# Patient Record
Sex: Male | Born: 1990 | Race: White | Hispanic: No | Marital: Single | Smoking: Current every day smoker
Health system: Southern US, Community
[De-identification: ages and names within clinical notes are randomized; demographics above are authoritative.]

---

## 2013-05-03 ENCOUNTER — Encounter (HOSPITAL_COMMUNITY): Payer: Self-pay | Admitting: Emergency Medicine

## 2013-05-03 ENCOUNTER — Emergency Department (HOSPITAL_COMMUNITY)
Admission: EM | Admit: 2013-05-03 | Discharge: 2013-05-03 | Disposition: A | Discharge status: Home / Self Care | Payer: 59 | Source: Home / Self Care

## 2013-05-03 DIAGNOSIS — R05 Cough: Secondary | ICD-10-CM

## 2013-05-03 DIAGNOSIS — J9801 Acute bronchospasm: Secondary | ICD-10-CM

## 2013-05-03 DIAGNOSIS — R059 Cough, unspecified: Secondary | ICD-10-CM

## 2013-05-03 DIAGNOSIS — J069 Acute upper respiratory infection, unspecified: Secondary | ICD-10-CM

## 2013-05-03 MED ORDER — ALBUTEROL SULFATE HFA 108 (90 BASE) MCG/ACT IN AERS: 2.0000 | INHALATION_SPRAY | RESPIRATORY_TRACT | Status: DC | PRN

## 2013-05-03 NOTE — ED Provider Notes (Signed)
CSN: [id number]     Arrival date & time 05/03/13  1246 History   First MD Initiated Contact with Patient 05/03/13 1432     No chief complaint on file.  (Consider location/radiation/quality/duration/timing/severity/associated sxs/prior Treatment) HPI Comments: Cough, congestion, nasal congestion, rhinorrhea, PND, itchy throat and ears. Fever x 1 d 1 wk ago. Since resoved Smoker   History reviewed. No pertinent past medical history. History reviewed. No pertinent past surgical history. History reviewed. No pertinent family history. History  Substance Use Topics  . Smoking status: Current Every Day Smoker -- 0.10 packs/day    Types: Cigarettes  . Smokeless tobacco: Not on file  . Alcohol Use: Yes     Comment: occasional    Review of Systems  Constitutional: Positive for fever and activity change. Negative for chills, diaphoresis and fatigue.  HENT: Positive for congestion, postnasal drip, rhinorrhea and sore throat. Negative for ear pain and facial swelling.   Eyes: Negative for pain, discharge and redness.  Respiratory: Positive for cough. Negative for chest tightness, shortness of breath and wheezing.   Cardiovascular: Negative.   Gastrointestinal: Negative.   Musculoskeletal: Negative.  Negative for neck pain and neck stiffness.  Neurological: Negative.     Allergies  Review of patient's allergies indicates no known allergies.  Home Medications   Current Outpatient Rx  Name  Route  Sig  Dispense  Refill  . pseudoephedrine (SUDAFED) 30 MG tablet   Oral   Take 30 mg by mouth every 4 (four) hours as needed for congestion.          BP 118/85  Pulse 79  Temp(Src) 98 F (36.7 C) (Oral)  Resp 16  SpO2 100% Physical Exam  Nursing note and vitals reviewed. Constitutional: He is oriented to person, place, and time. He appears well-developed and well-nourished. No distress.  HENT:  Mouth/Throat: No oropharyngeal exudate.  bilat TM's nl OP with erythema, cobblestoning,  clear PND  Eyes: Conjunctivae and EOM are normal.  Neck: Normal range of motion. Neck supple.  Cardiovascular: Normal rate, regular rhythm and normal heart sounds.   Pulmonary/Chest: Effort normal and breath sounds normal. No respiratory distress.  Faint expiratory wheeze and modest prolonged expiratory phase.   Musculoskeletal: Normal range of motion. He exhibits no edema.  Lymphadenopathy:    He has no cervical adenopathy.  Neurological: He is alert and oriented to person, place, and time. He exhibits normal muscle tone.  Skin: Skin is warm and dry. No rash noted.  Psychiatric: He has a normal mood and affect.    ED Course  Procedures (including critical care time) Labs Review Labs Reviewed - No data to display Imaging Review No results found.   MDM   1. URI (upper respiratory infection)   2. Cough   3. Bronchospasm    Alka Seltzer COld Plus Night Robitussin DM Albuterol HFA Stop smoking.    Hayden Rasmussenavid Marshea Wisher, NP 05/03/13 1454

## 2013-05-03 NOTE — ED Notes (Signed)
C/o breathing problems States he does have cold sx; cough, congestion, vomiting and diarrhea Cough med and rest was used as tx No hx of asthma

## 2013-05-03 NOTE — ED Notes (Signed)
Cold symptoms since last Tues., N,V and D on Sat, Sun. and Mon.  Had a fever early AM 3/10 and stopped on 3/12.

## 2013-05-03 NOTE — ED Provider Notes (Signed)
Medical screening examination/treatment/procedure(s) were performed by resident physician or non-physician practitioner and as supervising physician I was immediately available for consultation/collaboration.   Dekker Verga DOUGLAS MD.   Arrayah Connors D Cordel Drewes, MD 05/03/13 1550 

## 2013-05-03 NOTE — Discharge Instructions (Signed)
Bronchospasm, Adult A bronchospasm is a spasm or tightening of the airways going into the lungs. During a bronchospasm breathing becomes more difficult because the airways get smaller. When this happens there can be coughing, a whistling sound when breathing (wheezing), and difficulty breathing. Bronchospasm is often associated with asthma, but not all patients who experience a bronchospasm have asthma. CAUSES  A bronchospasm is caused by inflammation or irritation of the airways. The inflammation or irritation may be triggered by:   Allergies (such as to animals, pollen, food, or mold). Allergens that cause bronchospasm may cause wheezing immediately after exposure or many hours later.   Infection. Viral infections are believed to be the most common cause of bronchospasm.   Exercise.   Irritants (such as pollution, cigarette smoke, strong odors, aerosol sprays, and paint fumes).   Weather changes. Winds increase molds and pollens in the air. Rain refreshes the air by washing irritants out. Cold air may cause inflammation.   Stress and emotional upset.  SIGNS AND SYMPTOMS   Wheezing.   Excessive nighttime coughing.   Frequent or severe coughing with a simple cold.   Chest tightness.   Shortness of breath.  DIAGNOSIS  Bronchospasm is usually diagnosed through a history and physical exam. Tests, such as chest X-rays, are sometimes done to look for other conditions. TREATMENT   Inhaled medicines can be given to open up your airways and help you breathe. The medicines can be given using either an inhaler or a nebulizer machine.  Corticosteroid medicines may be given for severe bronchospasm, usually when it is associated with asthma. HOME CARE INSTRUCTIONS   Always have a plan prepared for seeking medical care. Know when to call your health care provider and local emergency services (911 in the [place] Know where you can access local emergency care.  Only take medicines as  directed by your health care provider.  If you were prescribed an inhaler or nebulizer machine, ask your health care provider to explain how to use it correctly. Always use a spacer with your inhaler if you were given one.  It is necessary to remain calm during an attack. Try to relax and breathe more slowly.  Control your home environment in the following ways:   Change your heating and air conditioning filter at least once a month.   Limit your use of fireplaces and wood stoves.  Do not smoke and do not allow smoking in your home.   Avoid exposure to perfumes and fragrances.   Get rid of pests (such as roaches and mice) and their droppings.   Throw away plants if you see mold on them.   Keep your house clean and dust free.   Replace carpet with wood, tile, or vinyl flooring. Carpet can trap dander and dust.   Use allergy-proof pillows, mattress covers, and box spring covers.   Wash bed sheets and blankets every week in hot water and dry them in a dryer.   Use blankets that are made of polyester or cotton.   Wash hands frequently. SEEK MEDICAL CARE IF:   You have muscle aches.   You have chest pain.   The sputum changes from clear or white to yellow, green, gray, or bloody.   The sputum you cough up gets thicker.   There are problems that may be related to the medicine you are given, such as a rash, itching, swelling, or trouble breathing.  SEEK IMMEDIATE MEDICAL CARE IF:   You have worsening wheezing and coughing  even after taking your prescribed medicines.   You have increased difficulty breathing.   You develop severe chest pain. MAKE SURE YOU:   Understand these instructions.  Will watch your condition.  Will get help right away if you are not doing well or get worse. Document Released: 02/06/2003 Document Revised: 10/06/2012 Document Reviewed: 07/26/2012 [facility] Patient Information 2014 [place], [place].  How to Use an Inhaler Using  your inhaler correctly is very important. Good technique will make sure that the medicine reaches your lungs.  HOW TO USE AN INHALER: 1. Take the cap off the inhaler. 2. If this is the first time using your inhaler, you need to prime it. Shake the inhaler for 5 seconds. Release four puffs into the air, away from your face. Ask your doctor for help if you have questions. 3. Shake the inhaler for 5 seconds. 4. Turn the inhaler so the bottle is above the mouthpiece. 5. Put your pointer finger on top of the bottle. Your thumb holds the bottom of the inhaler. 6. Open your mouth. 7. Either hold the inhaler away from your mouth (the width of 2 fingers) or place your lips tightly around the mouthpiece. Ask your doctor which way to use your inhaler. 8. Breathe out as much air as possible. 9. Breathe in and push down on the bottle 1 time to release the medicine. You will feel the medicine go in your mouth and throat. 10. Continue to take a deep breath in very slowly. Try to fill your lungs. 11. After you have breathed in completely, hold your breath for 10 seconds. This will help the medicine to settle in your lungs. If you cannot hold your breath for 10 seconds, hold it for as long as you can before you breathe out. 12. Breathe out slowly, through pursed lips. Whistling is an example of pursed lips. 13. If your doctor has told you to take more than 1 puff, wait at least 15 30 seconds between puffs. This will help you get the best results from your medicine. Do not use the inhaler more than your doctor tells you to. 14. Put the cap back on the inhaler. 15. Follow the directions from your doctor or from the inhaler package about cleaning the inhaler. If you use more than one inhaler, ask your doctor which inhalers to use and what order to use them in. Ask your doctor to help you figure out when you will need to refill your inhaler.  If you use a steroid inhaler, always rinse your mouth with water after your  last puff, gargle and spit out the water. Do not swallow the water. GET HELP IF:  The inhaler medicine only partially helps to stop wheezing or shortness of breath.  You are having trouble using your inhaler.  You have some increase in thick spit (phlegm). GET HELP RIGHT AWAY IF:  The inhaler medicine does not help your wheezing or shortness of breath or you have tightness in your chest.  You have dizziness, headaches, or fast heart rate.  You have chills, fever, or night sweats.  You have a large increase of thick spit, or your thick spit is bloody. MAKE SURE YOU:   Understand these instructions.  Will watch your condition.  Will get help right away if you are not doing well or get worse. Document Released: 11/13/2007 Document Revised: 11/24/2012 Document Reviewed: 09/02/2012 [facility] Patient Information 2014 [place], [place].  Upper Respiratory Infection, Adult An upper respiratory infection (URI) is also known  as the common cold. It is often caused by a type of germ (virus). Colds are easily spread (contagious). You can pass it to others by kissing, coughing, sneezing, or drinking out of the same glass. Usually, you get better in 1 or 2 weeks.  HOME CARE   Only take medicine as told by your doctor.  Use a warm mist humidifier or breathe in steam from a hot shower.  Drink enough water and fluids to keep your pee (urine) clear or pale yellow.  Get plenty of rest.  Return to work when your temperature is back to normal or as told by your doctor. You may use a face mask and wash your hands to stop your cold from spreading. GET HELP RIGHT AWAY IF:   After the first few days, you feel you are getting worse.  You have questions about your medicine.  You have chills, shortness of breath, or brown or red spit (mucus).  You have yellow or brown snot (nasal discharge) or pain in the face, especially when you bend forward.  You have a fever, puffy (swollen) neck, pain when you  swallow, or white spots in the back of your throat.  You have a bad headache, ear pain, sinus pain, or chest pain.  You have a high-pitched whistling sound when you breathe in and out (wheezing).  You have a lasting cough or cough up blood.  You have sore muscles or a stiff neck. MAKE SURE YOU:   Understand these instructions.  Will watch your condition.  Will get help right away if you are not doing well or get worse. Document Released: 07/23/2007 Document Revised: 04/28/2011 Document Reviewed: 06/10/2010 [facility] Patient Information 2014 [place], [place].

## 2015-07-10 ENCOUNTER — Encounter (HOSPITAL_COMMUNITY): Payer: Self-pay | Admitting: Emergency Medicine

## 2015-07-10 ENCOUNTER — Ambulatory Visit (HOSPITAL_COMMUNITY)
Admission: RE | Admit: 2015-07-10 | Discharge: 2015-07-10 | Disposition: A | Discharge status: Home / Self Care | Payer: 59 | Attending: Psychiatry | Admitting: Psychiatry

## 2015-07-10 DIAGNOSIS — F331 Major depressive disorder, recurrent, moderate: Secondary | ICD-10-CM | POA: Diagnosis present

## 2015-07-10 DIAGNOSIS — F17210 Nicotine dependence, cigarettes, uncomplicated: Secondary | ICD-10-CM | POA: Insufficient documentation

## 2015-07-10 DIAGNOSIS — F1010 Alcohol abuse, uncomplicated: Secondary | ICD-10-CM | POA: Diagnosis present

## 2015-07-10 NOTE — BH Assessment (Addendum)
Assessment Note  Robert Mayer is a 25 y.o. male [facility] due to "depression has been spiking and bringing thoughts to hurt myself". Pt shared that he took a bunch of ibuprofen a week and a half ago in a suicide attempt and yesterday, [place]. Pt reports that his parents IVC'd him and [facility] and was d/c this morning. Pt denies current SI, but indicates that he has an appt with his therapist tomorrow and an initial appt scheduled with a psychiatrist on 07/26/15. Pt reported that he only feels suicidal when he's coming down from a binge drinking episode.   Diagnosis: MDD, recurrent episode, moderate; Alcohol use d/o  Past Medical History: History reviewed. No pertinent past medical history.  History reviewed. No pertinent past surgical history.  Family History: History reviewed. No pertinent family history.  Social History:  reports that he has been smoking Cigarettes.  He has been smoking about 0.10 packs per day. He does not have any smokeless tobacco history on file. He reports that he drinks alcohol. He reports that he does not use illicit drugs.  Additional Social History:  Alcohol / Drug Use Pain Medications: none reported Prescriptions: none reported Over the Counter: none reported History of alcohol / drug use?: Yes Longest period of sobriety (when/how long): 6 months Substance #1 Name of Substance 1: Alcohol 1 - Age of First Use: 11 1 - Amount (size/oz): binge drinking 1 - Frequency: depends on level of depression/anxiety 1 - Duration: 3 months 1 - Last Use / Amount: Sunday night into Monday morning (5/21-5/22)  CIWA:   COWS:    Allergies: No Known Allergies  Home Medications:  (Not in a hospital admission)  OB/GYN Status:  No LMP for male patient.  General Assessment Data [facility] T[facility] Assessment: In system Is this a Tele  or Face-to-Face Assessment?: Face-to-Face Is this an Initial Assessment or a Re-assessment for this encounter?: Initial Assessment Marital status: Single Is patient pregnant?: No Pregnancy Status: No Living Arrangements: Other (Comment) (roommate) Can pt return to current living arrangement?: Yes Admission Status: Voluntary Is patient capable of signing voluntary admission?: Yes Referral Source: Self/Family/Friend [facility]  Medical Screening Exam [facility] Walk[facility] ONLY) Medical Exam completed: No Reason for MSE not completed: Patient Refused  Crisis Care Plan Living Arrangements: Other (Comment) (roommate) Name of Psychiatrist: First appt for 07/26/2015-pt didn't have provider info on hand Name of Therapist: Rochel BromeGray [place] w/ [facility]  Education Status Is patient currently in school?: No  Risk to self with the past 6 months Suicidal Ideation: No-Not Currently/Within Last 6 Months Has patient been a risk to self within the past 6 months prior to admission? : Yes Suicidal Intent: No-Not Currently/Within Last 6 Months Has patient had any suicidal intent within the past 6 months prior to admission? : Yes Is patient at risk for suicide?: No Suicidal Plan?: No-Not Currently/Within Last 6 Months Has patient had any suicidal plan within the past 6 months prior to admission? : Yes Access to Means: Yes Specify Access to Suicidal Means: pt has access to ibuprofen  What has been your use of drugs/alcohol within the last 12 months?: see above Previous Attempts/Gestures: Yes How many times?: 1 Other Self Harm Risks: 0 Triggers for Past Attempts: Other (Comment) (coming off of drinking binge) Intentional Self Injurious Behavior: None Family Suicide History:  No Recent stressful life event(s): Other (Comment) (relapsed on alcohol) Persecutory voices/beliefs?: No Depression: Yes Depression Symptoms: Isolating Substance abuse history and/or treatment for substance abuse?:  No Suicide prevention information given to non-admitted patients: Yes  Risk to Others within the past 6 months Homicidal Ideation: No Does patient have any lifetime risk of violence toward others beyond the six months prior to admission? : No Thoughts of Harm to Others: No Current Homicidal Intent: No Current Homicidal Plan: No Access to Homicidal Means: No History of harm to others?: No Assessment of Violence: None Noted Violent Behavior Description: none noted Does patient have access to weapons?: No Criminal Charges Pending?: No Does patient have a court date: No Is patient on probation?: No  Psychosis Hallucinations: None noted Delusions: None noted  Mental Status Report Appearance/Hygiene: Unremarkable Eye Contact: Good Motor Activity: Unremarkable Speech: Logical/coherent Level of Consciousness: Alert Mood: Sad, Pleasant Affect: Appropriate to circumstance Anxiety Level: Minimal Thought Processes: Coherent, Relevant Judgement: Unimpaired Orientation: Person, Place, Time, Situation, Appropriate for developmental age Obsessive Compulsive Thoughts/Behaviors: None  Cognitive Functioning Concentration: Normal Memory: Recent Intact, Remote Intact IQ: Average Insight: Good Impulse Control: Fair Appetite: Poor Weight Loss: 0 Sleep: No Change Vegetative Symptoms: None  ADLScreening [facility] Assessment Services) Patient's cognitive ability adequate to safely complete daily activities?: Yes Patient able to express need for assistance with ADLs?: Yes Independently performs ADLs?: Yes (appropriate for developmental age)  Prior Inpatient Therapy Prior Inpatient Therapy: Yes Prior Therapy Dates: 06/2014; 06/2015 Prior Therapy Facilty/Provider(s): [facility] in [place]; [facility] Reason for Treatment: Alcoholism; IVC  Prior Outpatient Therapy Prior Outpatient Therapy: No Does patient have an ACCT team?: No Does patient have Intensive In-House Services?  : No Does  patient have [facility] services? : No Does patient have P4CC services?: No  ADL Screening (condition at time of admission) Patient's cognitive ability adequate to safely complete daily activities?: Yes Is the patient deaf or have difficulty hearing?: No Does the patient have difficulty seeing, even when wearing glasses/contacts?: No Does the patient have difficulty concentrating, remembering, or making decisions?: No Patient able to express need for assistance with ADLs?: Yes Does the patient have difficulty dressing or bathing?: No Independently performs ADLs?: Yes (appropriate for developmental age) Does the patient have difficulty walking or climbing stairs?: No Weakness of Legs: None Weakness of Arms/Hands: None  Home Assistive Devices/Equipment Home Assistive Devices/Equipment: None  Therapy Consults (therapy consults require a physician order) PT Evaluation Needed: No OT Evalulation Needed: No SLP Evaluation Needed: No Abuse/Neglect Assessment (Assessment to be complete while patient is alone) Physical Abuse: Denies Verbal Abuse: Denies Sexual Abuse: Denies Exploitation of patient/patient's resources: Denies Self-Neglect: Denies Values / Beliefs Cultural Requests During Hospitalization: None Spiritual Requests During Hospitalization: None Consults Spiritual Care Consult Needed: No Social Work Consult Needed: No Merchant navy officer (For Healthcare) Does patient have an advance directive?: No Would patient like information on creating an advanced directive?: No - patient declined information    Additional Information 1:1 In Past 12 Months?: No CIRT Risk: No Elopement Risk: No Does patient have medical clearance?: No     Disposition:  Disposition Initial Assessment Completed for this Encounter: Yes Disposition of Patient: Other dispositions (consulted with Hillery Jacks, NP) Other disposition(s): To current provider  On Site Evaluation by:   Reviewed with  Physician:    Laddie Aquas 07/10/2015 6:57 PM

## 2017-11-05 ENCOUNTER — Other Ambulatory Visit: Payer: Self-pay

## 2017-11-05 ENCOUNTER — Ambulatory Visit (HOSPITAL_COMMUNITY)
Admission: EM | Admit: 2017-11-05 | Discharge: 2017-11-05 | Disposition: A | Discharge status: Home / Self Care | Payer: Self-pay | Attending: Family Medicine | Admitting: Family Medicine

## 2017-11-05 ENCOUNTER — Ambulatory Visit (INDEPENDENT_AMBULATORY_CARE_PROVIDER_SITE_OTHER): Payer: Self-pay

## 2017-11-05 ENCOUNTER — Encounter (HOSPITAL_COMMUNITY): Payer: Self-pay | Admitting: *Deleted

## 2017-11-05 DIAGNOSIS — S52201A Unspecified fracture of shaft of right ulna, initial encounter for closed fracture: Secondary | ICD-10-CM

## 2017-11-05 DIAGNOSIS — S59911A Unspecified injury of right forearm, initial encounter: Secondary | ICD-10-CM

## 2017-11-05 NOTE — ED Notes (Signed)
Ortho tech paged and on the way. 

## 2017-11-05 NOTE — ED Provider Notes (Signed)
[facility]   [id number] 11/05/17 Arrival Time: 1401  ASSESSMENT & PLAN:  1. Forearm injury, right, initial encounter   2. Closed fracture of shaft of right ulna, unspecified fracture morphology, initial encounter    Imaging: Dg Forearm Right  Result Date: 11/05/2017 CLINICAL DATA:  Direct trauma to the right forearm now with pain in the medial mid ulnar region with limited range of motion. EXAM: RIGHT FOREARM - 2 VIEW COMPARISON:  None. FINDINGS: The patient has sustained an acute nondisplaced fracture of the junction of the middle and distal thirds of the right ulna. The radius is intact. There is mild soft tissue swelling over the extensor surface of the forearm. The observed portions of the wrist and elbow are normal. IMPRESSION: There is an acute nondisplaced fracture of the junction of the middle and distal thirds of the shaft of the right ulna. Electronically Signed   By: David  [place] M.D.   On: 11/05/2017 14:46   Follow-up Information    Schedule an appointment as soon as possible for a visit  with Bjorn Pippin, MD.   Specialty:  Orthopedic Surgery Contact information: [address] [place] [address] [phone]          Splinted by orthopaedic tech. OTC analgesics as needed.  Reviewed expectations re: course of current medical issues. Questions answered. Outlined signs and symptoms indicating need for more acute intervention. Patient verbalized understanding. After Visit Summary given.  SUBJECTIVE: History from: patient. Robert Mayer is a 27 y.o. male who reports persistent moderate pain of his right forearm; described as aching without radiation. Onset: noticed about 2 days ago after hitting forearm "on something" the night before at a concert/mosh pit; continued discomfort Relieved by: holding still. Worsened by: movement. Associated symptoms: none reported. Extremity sensation changes or weakness: none. Self treatment: has not  tried OTCs for relief of pain. History of similar: no  ROS: As per HPI.   OBJECTIVE:  Vitals:   11/05/17 1412  BP: 136/87  Pulse: 85  Resp: 14  Temp: 98.2 F (36.8 C)  TempSrc: Oral  SpO2: 98%    General appearance: alert; no distress Extremities: warm and well perfused; symmetrical with no gross deformities; poorly localized tenderness over mid R forearm with no swelling and mild bruising; ROM around area or areas of discomfort: normal at R wrist and elbow CV: brisk extremity capillary refill Skin: warm and dry Neurologic: normal gait; normal symmetric reflexes in all extremities; normal sensation in all extremities Psychological: alert and cooperative; normal mood and affect  No Known Allergies   Social History   Socioeconomic History  . Marital status: Single    Spouse name: Not on file  . Number of children: Not on file  . Years of education: Not on file  . Highest education level: Not on file  Occupational History  . Not on file  Social Needs  . Financial resource strain: Not on file  . Food insecurity:    Worry: Not on file    Inability: Not on file  . Transportation needs:    Medical: Not on file    Non-medical: Not on file  Tobacco Use  . Smoking status: Current Every Day Smoker    Packs/day: 0.10    Types: Cigarettes  . Smokeless tobacco: Never Used  Substance and Sexual Activity  . Alcohol use: Yes    Comment: binge drinking  . Drug use: No  . Sexual activity: Not on file  Lifestyle  .  Physical activity:    Days per week: Not on file    Minutes per session: Not on file  . Stress: Not on file  Relationships  . Social connections:    Talks on phone: Not on file    Gets together: Not on file    Attends religious service: Not on file    Active member of club or organization: Not on file    Attends meetings of clubs or organizations: Not on file    Relationship status: Not on file  Other Topics Concern  . Not on file  Social History  Narrative  . Not on file    History reviewed. No pertinent surgical history.    Mardella LaymanHagler, Angellee Cohill, MD 11/14/17 1008

## 2017-11-05 NOTE — Discharge Instructions (Signed)
Please rest, ice and elevate the affected extremity. If not allergic, you may take Motrin 600mg every 8 hours or Tylenol 1000mg every 6 hours or as needed for discomfort. Follow up with orthopaedic surgery within one week for further evaluation. Please call for any appointment. Do not remove your splint. You may use a garbage bag while showering to keep your splint dry. Please return here if you are experiencing increased pain, tingling/numbness, swelling, redness, or fever. °
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
## 2017-11-05 NOTE — Progress Notes (Signed)
Orthopedic Tech Progress Note Patient Details:  Robert Mayer 6/10/Robert Most1992 [id number]  Ortho Devices Type of Ortho Device: Ace wrap, Arm sling, Long arm splint Ortho Device/Splint Location: rue Ortho Device/Splint Interventions: Application   Post Interventions Patient Tolerated: Well Instructions Provided: Care of device   Robert Mayer, Robert Mayer 11/05/2017, 3:27 PM

## 2018-03-29 ENCOUNTER — Emergency Department (HOSPITAL_COMMUNITY)
Admission: EM | Admit: 2018-03-29 | Discharge: 2018-03-30 | Disposition: A | Discharge status: Home / Self Care | Payer: Self-pay | Attending: Emergency Medicine | Admitting: Emergency Medicine

## 2018-03-29 ENCOUNTER — Encounter (HOSPITAL_COMMUNITY): Payer: Self-pay

## 2018-03-29 DIAGNOSIS — Y908 Blood alcohol level of 240 mg/100 ml or more: Secondary | ICD-10-CM | POA: Insufficient documentation

## 2018-03-29 DIAGNOSIS — R45851 Suicidal ideations: Secondary | ICD-10-CM | POA: Insufficient documentation

## 2018-03-29 DIAGNOSIS — Z9114 Patient's other noncompliance with medication regimen: Secondary | ICD-10-CM | POA: Insufficient documentation

## 2018-03-29 DIAGNOSIS — Z046 Encounter for general psychiatric examination, requested by authority: Secondary | ICD-10-CM | POA: Insufficient documentation

## 2018-03-29 DIAGNOSIS — F329 Major depressive disorder, single episode, unspecified: Secondary | ICD-10-CM | POA: Insufficient documentation

## 2018-03-29 DIAGNOSIS — F1010 Alcohol abuse, uncomplicated: Secondary | ICD-10-CM | POA: Insufficient documentation

## 2018-03-29 DIAGNOSIS — Z7289 Other problems related to lifestyle: Secondary | ICD-10-CM | POA: Insufficient documentation

## 2018-03-29 DIAGNOSIS — F331 Major depressive disorder, recurrent, moderate: Secondary | ICD-10-CM

## 2018-03-29 DIAGNOSIS — F17210 Nicotine dependence, cigarettes, uncomplicated: Secondary | ICD-10-CM | POA: Insufficient documentation

## 2018-03-29 LAB — CBC
HCT: 48.8 % (ref 39.0–52.0)
HEMOGLOBIN: 16.1 g/dL (ref 13.0–17.0)
MCH: 31.3 pg (ref 26.0–34.0)
MCHC: 33 g/dL (ref 30.0–36.0)
MCV: 94.9 fL (ref 80.0–100.0)
NRBC: 0 % (ref 0.0–0.2)
Platelets: 251 10*3/uL (ref 150–400)
RBC: 5.14 MIL/uL (ref 4.22–5.81)
RDW: 13.1 % (ref 11.5–15.5)
WBC: 8.1 10*3/uL (ref 4.0–10.5)

## 2018-03-29 LAB — COMPREHENSIVE METABOLIC PANEL
ALK PHOS: 89 U/L (ref 38–126)
ALT: 210 U/L — ABNORMAL HIGH (ref 0–44)
AST: 104 U/L — ABNORMAL HIGH (ref 15–41)
Albumin: 4.7 g/dL (ref 3.5–5.0)
Anion gap: 10 (ref 5–15)
BUN: 6 mg/dL (ref 6–20)
CHLORIDE: 107 mmol/L (ref 98–111)
CO2: 25 mmol/L (ref 22–32)
Calcium: 8.8 mg/dL — ABNORMAL LOW (ref 8.9–10.3)
Creatinine, Ser: 0.93 mg/dL (ref 0.61–1.24)
GFR calc Af Amer: >60 mL/min (ref >60)
GFR calc non Af Amer: >60 mL/min (ref >60)
Glucose, Bld: 110 mg/dL — ABNORMAL HIGH (ref 70–99)
POTASSIUM: 3.8 mmol/L (ref 3.5–5.1)
SODIUM: 142 mmol/L (ref 135–145)
Total Bilirubin: 0.5 mg/dL (ref 0.3–1.2)
Total Protein: 8 g/dL (ref 6.5–8.1)

## 2018-03-29 LAB — RAPID URINE DRUG SCREEN, HOSP PERFORMED
Amphetamines: NOT DETECTED
BENZODIAZEPINES: NOT DETECTED
Barbiturates: NOT DETECTED
COCAINE: NOT DETECTED
OPIATES: NOT DETECTED
TETRAHYDROCANNABINOL: NOT DETECTED

## 2018-03-29 LAB — ACETAMINOPHEN LEVEL: Acetaminophen (Tylenol), Serum: <10 ug/mL — ABNORMAL LOW (ref 10–30)

## 2018-03-29 LAB — SALICYLATE LEVEL

## 2018-03-29 LAB — ETHANOL: Alcohol, Ethyl (B): 283 mg/dL — ABNORMAL HIGH (ref <10)

## 2018-03-29 MED ORDER — LORAZEPAM 2 MG/ML IJ SOLN: 0.0000 mg | Freq: Four times a day (QID) | INTRAMUSCULAR | Status: DC

## 2018-03-29 MED ORDER — LORAZEPAM 2 MG/ML IJ SOLN: 0.0000 mg | Freq: Two times a day (BID) | INTRAMUSCULAR | Status: DC

## 2018-03-29 MED ORDER — THIAMINE HCL 100 MG/ML IJ SOLN: 100.0000 mg | Freq: Every day | INTRAMUSCULAR | Status: DC

## 2018-03-29 MED ORDER — VITAMIN B-1 100 MG PO TABS
100.0000 mg | ORAL_TABLET | Freq: Every day | ORAL | Status: DC
  Administered 2018-03-29 – 2018-03-30 (×2): 100 mg via ORAL
  Filled 2018-03-29 (×2): qty 1

## 2018-03-29 MED ORDER — LORAZEPAM 1 MG PO TABS: 0.0000 mg | ORAL_TABLET | Freq: Two times a day (BID) | ORAL | Status: DC

## 2018-03-29 MED ORDER — LORAZEPAM 1 MG PO TABS: 0.0000 mg | ORAL_TABLET | Freq: Four times a day (QID) | ORAL | Status: DC

## 2018-03-29 NOTE — ED Notes (Addendum)
Pt alert and oriented. Pt denies any si,hi, or avh at this time. Pt cooperative and answers questions approprietly. Pt resting, will continue to monitor.

## 2018-03-29 NOTE — ED Triage Notes (Addendum)
Patient c/o depression that has worsened and SI thought x3 weeks. Patient had not been taking medications for over a year due to affordability.   Patient tried to hang himself twice in the past week at home. First time his friends walked in and second time his mother walked in.  Patient lives alone.   Patient tearful during triage.   Recently broke up with his girlfriend and lost his dog.   A/Ox4 Ambulatory in triage.  Patient dressed our in burgundy scrubs.  Urine sample provided.  Clothing and items put into belonging bag.   Phone and cords locked up in cabinets.

## 2018-03-29 NOTE — ED Notes (Signed)
Bed: [facility] Expected date:  Expected time:  Means of arrival:  Comments: 

## 2018-03-29 NOTE — BH Assessment (Signed)
Assessment Note  Robert Mayer is an 28 y.o. male presenting increased depression and SI within the "past couple of days". Patient was brought in by his mother after disclosing to her how he felt. Patient reported 2 suicide attempts by hanging within the past week, stated earlier "past couple of days", patient seemed unable to clarify, stating he didn't know what days it was. Patient reported trying to hang himself twice in the past week at home, the first time his friends walked in and second time his mother walked in. Patient reported living alone. Patient reported progression of triggers, not being on medication for over a year due to affordability, lost his dog, lost job last Friday and break-up with girlfriend. Patient reported increased drinking, when asked how much, he stated "too much". Patient reported inpatient mental health treatment for SI 5 years ago, which patient states he was then placed on antidepressant medication. Patient reported medicine did help when he was compliant with taking medication. Patient denied self harming behaviors. Patient denied present SI, denied HI and psychosis. During assessment patient stated, "I am ready to go home to my mothers house and then go to [facility], I was going to go there first". Clinician informed patient of process and that if he meets inpatient criteria that he could then get back on his medication. Patient was cooperative during assessment.   UDS negative BAL 283  Diagnosis: Major Depressive Disorder, Anxiety Disorder  Past Medical History: History reviewed. No pertinent past medical history.  History reviewed. No pertinent surgical history.  Family History: History reviewed. No pertinent family history.  Social History:  reports that he has been smoking cigarettes. He has been smoking about 0.10 packs per day. He has never used smokeless tobacco. He reports current alcohol use. He reports that he does not use drugs.  Additional Social History:   Alcohol / Drug Use Pain Medications: see MAR Prescriptions: see MAR Over the Counter: see MAR  CIWA: CIWA-Ar BP: 116/68 Pulse Rate: 97 COWS:    Allergies: No Known Allergies  Home Medications: (Not in a hospital admission)   OB/GYN Status:  No LMP for male patient.  General Assessment Data [facility] of Assessment: [facility] Assessment: In system Is this a Tele or Face-to-Face Assessment?: Face-to-Face Is this an Initial Assessment or a Re-assessment for this encounter?: Initial Assessment Patient Accompanied by:: N/A Language Other than English: No Living Arrangements: (home alone) What gender do you identify as?: Male Marital status: Single Pregnancy Status: (n/a) Living Arrangements: Alone Can pt return to current living arrangement?: Yes Admission Status: Voluntary Is patient capable of signing voluntary admission?: Yes Referral Source: Self/Family/Friend     Crisis Care Plan Living Arrangements: Alone Legal Guardian: (self) Name of Psychiatrist: (none) Name of Therapist: (none)  Education Status Is patient currently in school?: No Is the patient employed, unemployed or receiving disability?: Unemployed  Risk to self with the past 6 months Suicidal Ideation: No-Not Currently/Within Last 6 Months Has patient been a risk to self within the past 6 months prior to admission? : Yes Suicidal Intent: No-Not Currently/Within Last 6 Months Has patient had any suicidal intent within the past 6 months prior to admission? : Yes Is patient at risk for suicide?: Yes Suicidal Plan?: Yes-Currently Present Has patient had any suicidal plan within the past 6 months prior to admission? : Yes Specify Current Suicidal Plan: (hanging) Access to Means: Yes Specify Access to Suicidal Means: (hang, rope in the home) What has been your use of  drugs/alcohol within the last 12 months?: ("drinking a lot every couple of days") Previous Attempts/Gestures: Yes How many times?:  (2) Other Self Harm Risks: (none reported) Triggers for Past Attempts: Other (Comment)(lost dog, broke up with girlfriend, lost job, off medication) Intentional Self Injurious Behavior: None Family Suicide History: No Recent stressful life event(s): Job Loss(break up with girlfriend, lost dog and job, off medications) Persecutory voices/beliefs?: No Depression: Yes Depression Symptoms: Isolating(increased anxiety) Substance abuse history and/or treatment for substance abuse?: (alcohol every couple of days)  Risk to Others within the past 6 months Homicidal Ideation: No Does patient have any lifetime risk of violence toward others beyond the six months prior to admission? : No Thoughts of Harm to Others: No Current Homicidal Intent: No Current Homicidal Plan: No Access to Homicidal Means: No History of harm to others?: No Assessment of Violence: None Noted Violent Behavior Description: (none) Does patient have access to weapons?: No Criminal Charges Pending?: No Does patient have a court date: No Is patient on probation?: No  Psychosis Hallucinations: None noted Delusions: None noted  Mental Status Report Appearance/Hygiene: In scrubs Eye Contact: Fair Motor Activity: Unremarkable Speech: Logical/coherent Level of Consciousness: Alert Mood: Sad, Anxious Affect: Sad, Anxious Anxiety Level: Moderate Thought Processes: Coherent, Relevant Judgement: Partial Orientation: Person, Place, Situation Obsessive Compulsive Thoughts/Behaviors: None  Cognitive Functioning Concentration: Fair Memory: Recent Intact Is patient IDD: No Insight: Fair Impulse Control: Poor Appetite: Poor Have you had any weight changes? : No Change Sleep: Decreased Total Hours of Sleep: (2) Vegetative Symptoms: None  ADLScreening [facility] Assessment Services) Patient's cognitive ability adequate to safely complete daily activities?: Yes Patient able to express need for assistance with ADLs?:  Yes Independently performs ADLs?: Yes (appropriate for developmental age)  Prior Inpatient Therapy Prior Inpatient Therapy: Yes Prior Therapy Dates: (2015) Prior Therapy Facilty/Provider(s): (unknown) Reason for Treatment: (SI)  Prior Outpatient Therapy Prior Outpatient Therapy: No Does patient have an ACCT team?: No Does patient have Intensive In-House Services?  : No Does patient have [facility] services? : No Does patient have [facility] services?: No  ADL Screening (condition at time of admission) Patient's cognitive ability adequate to safely complete daily activities?: Yes Patient able to express need for assistance with ADLs?: Yes Independently performs ADLs?: Yes (appropriate for developmental age)    Merchant navy officer (For Healthcare) Does Patient Have a Medical Advance Directive?: No Would patient like information on creating a medical advance directive?: No - Patient declined    Disposition:  Disposition Initial Assessment Completed for this Encounter: Yes  Nira Conn, NP, patient meets inpatient criteria. Hassie Bruce, no male beds available on [facility]. TTS to seek placement. Melina Schools, RN, informed of disposition.  On Site Evaluation by:   Reviewed with Physician:    Burnetta Sabin, [facility] 03/29/2018 10:12 PM

## 2018-03-29 NOTE — ED Notes (Signed)
Bed: [facility]37 Expected date:  Expected time:  Means of arrival:  Comments: Triage 4  

## 2018-03-29 NOTE — ED Provider Notes (Signed)
[facility]-EMERGENCY DEPT Provider Note   CSN: [id number] Arrival date & time: 03/29/18  1657     History   Chief Complaint Chief Complaint  Patient presents with  . Suicidal    HPI Robert Mayer is a 28 y.o. male.  HPI Patient states he has been off his antidepressant.  He has had several life stressors including breaking up with a girlfriend and losing his dog as of late.  States has been drinking more alcohol recently.  Denies any other drug use.  Endorses suicidal thoughts but states that as he sobered, he is no longer suicidal.  He denies any homicidal ideation.  No visual or auditory hallucinations.  Asking for help getting placed back on his antidepression medication. History reviewed. No pertinent past medical history.  There are no active problems to display for this patient.   History reviewed. No pertinent surgical history.      Home Medications    Prior to Admission medications   Not on File    Family History History reviewed. No pertinent family history.  Social History Social History   Tobacco Use  . Smoking status: Current Every Day Smoker    Packs/day: 0.10    Types: Cigarettes  . Smokeless tobacco: Never Used  Substance Use Topics  . Alcohol use: Yes    Comment: binge drinking  . Drug use: No     Allergies   Patient has no known allergies.   Review of Systems Review of Systems  Constitutional: Negative for chills and fever.  Eyes: Negative for visual disturbance.  Respiratory: Negative for cough and shortness of breath.   Cardiovascular: Negative for chest pain.  Gastrointestinal: Negative for abdominal pain, diarrhea, nausea and vomiting.  Musculoskeletal: Negative for back pain and neck pain.  Skin: Negative for rash and wound.  Neurological: Negative for dizziness, weakness, light-headedness, numbness and headaches.  Psychiatric/Behavioral: Positive for dysphoric mood and suicidal ideas. Negative for  hallucinations.  All other systems reviewed and are negative.    Physical Exam Updated Vital Signs BP 116/68 (BP Location: Left Arm)   Pulse 97   Temp 98.4 F (36.9 C) (Oral)   Resp 16   Ht 5\' 9"  (1.753 m)   Wt 72.6 kg   SpO2 99%   BMI 23.63 kg/m   Physical Exam Vitals signs and nursing note reviewed.  Constitutional:      Appearance: Normal appearance. He is well-developed.     Comments: Drowsy but easily aroused.  HENT:     Head: Normocephalic and atraumatic.     Nose: Nose normal.     Mouth/Throat:     Mouth: Mucous membranes are moist.  Eyes:     Extraocular Movements: Extraocular movements intact.     Pupils: Pupils are equal, round, and reactive to light.  Neck:     Musculoskeletal: Normal range of motion and neck supple. No neck rigidity or muscular tenderness.  Cardiovascular:     Rate and Rhythm: Normal rate and regular rhythm.  Pulmonary:     Effort: Pulmonary effort is normal.     Breath sounds: Normal breath sounds.  Abdominal:     General: Bowel sounds are normal.     Palpations: Abdomen is soft.     Tenderness: There is no abdominal tenderness. There is no guarding or rebound.  Musculoskeletal: Normal range of motion.        General: No tenderness.  Lymphadenopathy:     Cervical: No cervical adenopathy.  Skin:  General: Skin is warm and dry.     Capillary Refill: Capillary refill takes less than 2 seconds.     Findings: No erythema or rash.  Neurological:     General: No focal deficit present.     Mental Status: He is alert and oriented to person, place, and time.     Comments: No slurring of words.  Psychiatric:     Comments: Denies suicidal or homicidal ideation.  Denies visual or auditory hallucinations.      ED Treatments / Results  Labs (all labs ordered are listed, but only abnormal results are displayed) Labs Reviewed  COMPREHENSIVE METABOLIC PANEL - Abnormal; Notable for the following components:      Result Value   Glucose,  Bld 110 (*)    Calcium 8.8 (*)    AST 104 (*)    ALT 210 (*)    All other components within normal limits  ETHANOL - Abnormal; Notable for the following components:   Alcohol, Ethyl (B) 283 (*)    All other components within normal limits  ACETAMINOPHEN LEVEL - Abnormal; Notable for the following components:   Acetaminophen (Tylenol), Serum <10 (*)    All other components within normal limits  SALICYLATE LEVEL  CBC  RAPID URINE DRUG SCREEN, HOSP PERFORMED    EKG None  Radiology No results found.  Procedures Procedures (including critical care time)  Medications Ordered in ED Medications  LORazepam (ATIVAN) injection 0-4 mg ( Intravenous See Alternative 03/29/18 2038)    Or  LORazepam (ATIVAN) tablet 0-4 mg (0 mg Oral Refused 03/29/18 2038)  LORazepam (ATIVAN) injection 0-4 mg (has no administration in time range)    Or  LORazepam (ATIVAN) tablet 0-4 mg (has no administration in time range)  thiamine (VITAMIN B-1) tablet 100 mg (100 mg Oral Given 03/29/18 2047)    Or  thiamine (B-1) injection 100 mg ( Intravenous See Alternative 03/29/18 2047)     Initial Impression / Assessment and Plan / ED Course  I have reviewed the triage vital signs and the nursing notes.  Pertinent labs & imaging results that were available during my care of the patient were reviewed by me and considered in my medical decision making (see chart for details).     Patient with elevated liver enzymes questionably due to recent increase in alcohol use.  Will need to be followed by gastroenterology but no acute intervention needed at this time.  He is cleared for psychiatric evaluation.  Final Clinical Impressions(s) / ED Diagnoses   Final diagnoses:  Suicidal ideation  Alcohol abuse    ED Discharge Orders    None       Loren RacerYelverton, Johnothan Bascomb, MD 03/29/18 2327

## 2018-03-29 NOTE — ED Notes (Signed)
Pt sleeping unable to asses at this time. 

## 2018-03-29 NOTE — ED Notes (Signed)
Robert Conn, NP, patient meets inpatient criteria. Hassie Bruce, no beds available on [facility]. TTS to seek placement. Melina Schools, RN, informed of disposition.
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
## 2018-03-30 ENCOUNTER — Other Ambulatory Visit: Payer: Self-pay

## 2018-03-30 DIAGNOSIS — F331 Major depressive disorder, recurrent, moderate: Secondary | ICD-10-CM

## 2018-03-30 MED ORDER — BUPROPION HCL ER (XL) 150 MG PO TB24: 150.0000 mg | ORAL_TABLET | ORAL | 0 refills | Status: AC

## 2018-03-30 MED ORDER — BUSPIRONE HCL 5 MG PO TABS: 5.0000 mg | ORAL_TABLET | Freq: Two times a day (BID) | ORAL | 0 refills | Status: AC

## 2018-03-30 NOTE — Discharge Instructions (Signed)
For your mental health needs, you are advised to follow up with [facility].  New and returning patients are seen at their walk-in clinic.  Walk-in hours are Monday - Friday from 8:00 am - 3:00 pm.  Walk-in patients are seen on a first come, first served basis.  Try to arrive as early as possible for the best chance of being seen the same day: ° °     [facility] °     [address] °     [place], [place] [address] °     [phone] °

## 2018-03-30 NOTE — ED Notes (Signed)
Denies SI/HI/AVH. Pt noted, "I was just drunk." CIWA 0, v/s wnl. Pt notes, "I would like to go home this morning."
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
## 2018-03-30 NOTE — BH Assessment (Addendum)
This clinician spoke with patient's mother, Lauro FranklinSherry Cilento, [phone]. She was informed that our psychiatrist is recommending in patient treatment. She states that she is comfortable with her son being discharged into her care and will ensure his safety, follow up with OPT appointments. Informed Dr. Sharma CovertNorman and Malachy Chamberakia Starkes, PMHNP.

## 2018-03-30 NOTE — BH Assessment (Signed)
[facility]  Per Juanetta BeetsJacqueline Norman, DO, this pt does not require psychiatric hospitalization at this time.  [facility].  This has been included in pt's discharge instructions.  Pt's nurse, Angelique BlonderDenise, has been notified.  Doylene Canninghomas Tzvi Economou, MA Triage Specialist [phone]

## 2018-03-30 NOTE — ED Notes (Signed)
Patient denies pain and is resting comfortably.  

## 2018-03-30 NOTE — Consult Note (Addendum)
[facility]   Reason for Consult:  Suicide ideations Referring Physician: Ranae PalmsYelverton Patient Identification: Robert Mayer MRN:  [id number] Principal Diagnosis: MDD (major depressive disorder), recurrent episode, moderate (HCC) Diagnosis:  Principal Problem:   MDD (major depressive disorder), recurrent episode, moderate (HCC)   Total Time spent with patient: 45 minutes  Subjective:   Robert Mayer is a 28 y.o. male patient admitted with worsening suicide ideation secondary to unmanaged depression and current stressors.  CC: "I got really drunk yesterday and I told my mother I wanted to come in because I was having bad thoughts".  HPI:  Robert Mayer is an 28 y.o. male who [facility]  increased depression and SI within the "past couple of days". Patient was brought in by his mother after disclosing to her how he felt. Patient reported trying to hang himself twice in the past week at home, the first time his friends walked in and second time his mother walked in. Patient reported inpatient mental health treatment for SI 5 years ago, which patient states he was then placed on antidepressant medication. Patient reported medicine did help when he was compliant with taking medication.  On Evaluation Today: Patient was lying on his bed, alert and oriented x 3. He was calm and co-operative during interview. He did not appear to be responding to internal stimuli. He reported worsening depression in the past 1 week as a result of stressors which include breakup with girlfriend of 2 years, losing his employment and loss of dog he had for 4 years (reports this dog was found 2 days ago). He reports overwhelming fatigue, poor appetite, insomnia, anhedonia, mood sadness and feelings of guilt. Patient reports whilst symptoms seemed to have worsened in the past 1-2 weeks, he had been having intermittent symptoms for about 2 years. States he had been coping with symptoms by binge  drinking which typically occurs about 2-3 times per week. Reports drinking over 12 beers during these episodes.Patient reports using Wellbutrin and Buspar 3 years ago after psychiatric hospitalization for depression and suicide ideation. He however stated he used the medication for only 1 year and thereafter stopped due to cost. So last contact with mental health services per pt was 2 years ago. He seems to minimize the 2 suicide attempts earlier this week, calling them "just a cry for help". Admits paternal uncle died by suicide.Today he denies any suicidal or homicidal ideation. No audio visual hallucinations at this time.  Past Psychiatric History: MDD, GAD, Bipolar Disorder  Risk to Self: Suicidal Ideation: No-Not Currently/Within Last 6 Months Suicidal Intent: No-Not Currently/Within Last 6 Months Is patient at risk for suicide?: Yes Suicidal Plan?: Yes-Currently Present Specify Current Suicidal Plan: (hanging) Access to Means: Yes Specify Access to Suicidal Means: (hang, rope in the home) What has been your use of drugs/alcohol within the last 12 months?: ("drinking a lot every couple of days") How many times?: (2) Other Self Harm Risks: (none reported) Triggers for Past Attempts: Other (Comment)(lost dog, broke up with girlfriend, lost job, off medication) Intentional Self Injurious Behavior: None Risk to Others: Homicidal Ideation: No Thoughts of Harm to Others: No Current Homicidal Intent: No Current Homicidal Plan: No Access to Homicidal Means: No History of harm to others?: No Assessment of Violence: None Noted Violent Behavior Description: (none) Does patient have access to weapons?: No Criminal Charges Pending?: No Does patient have a court date: No Prior Inpatient Therapy: Prior Inpatient Therapy: Yes Prior Therapy Dates: (2015) Prior  Therapy Facilty/Provider(s): (unknown) Reason for Treatment: (SI) Prior Outpatient Therapy: Prior Outpatient Therapy: No Does patient have  an ACCT team?: No Does patient have Intensive In-House Services?  : No Does patient have [facility] services? : No Does patient have [facility] services?: No  Past Medical History: History reviewed. No pertinent past medical history. History reviewed. No pertinent surgical history. Family History: History reviewed. No pertinent family history. Family Psychiatric  History: Aunts-GAD, Uncle-Sucide Social History:  Social History   Substance and Sexual Activity  Alcohol Use Yes   Comment: binge drinking     Social History   Substance and Sexual Activity  Drug Use No    Social History   Socioeconomic History  . Marital status: Single    Spouse name: Not on file  . Number of children: Not on file  . Years of education: Not on file  . Highest education level: Not on file  Occupational History  . Not on file  Social Needs  . Financial resource strain: Not on file  . Food insecurity:    Worry: Not on file    Inability: Not on file  . Transportation needs:    Medical: Not on file    Non-medical: Not on file  Tobacco Use  . Smoking status: Current Every Day Smoker    Packs/day: 0.10    Types: Cigarettes  . Smokeless tobacco: Never Used  Substance and Sexual Activity  . Alcohol use: Yes    Comment: binge drinking  . Drug use: No  . Sexual activity: Not on file  Lifestyle  . Physical activity:    Days per week: Not on file    Minutes per session: Not on file  . Stress: Not on file  Relationships  . Social connections:    Talks on phone: Not on file    Gets together: Not on file    Attends religious service: Not on file    Active member of club or organization: Not on file    Attends meetings of clubs or organizations: Not on file    Relationship status: Not on file  Other Topics Concern  . Not on file  Social History Narrative  . Not on file   Additional Social History: N/A    Allergies:  No Known Allergies  Labs:  Results for orders placed or performed during the  hospital encounter of 03/29/18 (from the past 48 hour(s))  Comprehensive metabolic panel     Status: Abnormal   Collection Time: 03/29/18  5:49 PM  Result Value Ref Range   Sodium 142 135 - 145 mmol/L   Potassium 3.8 3.5 - 5.1 mmol/L   Chloride 107 98 - 111 mmol/L   CO2 25 22 - 32 mmol/L   Glucose, Bld 110 (H) 70 - 99 mg/dL   BUN 6 6 - 20 mg/dL   Creatinine, Ser 4.09 0.61 - 1.24 mg/dL   Calcium 8.8 (L) 8.9 - 10.3 mg/dL   Total Protein 8.0 6.5 - 8.1 g/dL   Albumin 4.7 3.5 - 5.0 g/dL   AST 811 (H) 15 - 41 U/L   ALT 210 (H) 0 - 44 U/L   Alkaline Phosphatase 89 38 - 126 U/L   Total Bilirubin 0.5 0.3 - 1.2 mg/dL   GFR calc non Af Amer >60 >60 mL/min   GFR calc Af Amer >60 >60 mL/min   Anion gap 10 5 - 15    Comment: Performed at [facility], [address] [place],  [place] [address]  Ethanol     Status: Abnormal   Collection Time: 03/29/18  5:49 PM  Result Value Ref Range   Alcohol, Ethyl (B) 283 (H) <10 mg/dL    Comment: (NOTE) Lowest detectable limit for serum alcohol is 10 mg/dL. For medical purposes only. Performed at [facility], [address], [place], [place] [address]   Salicylate level     Status: None   Collection Time: 03/29/18  5:49 PM  Result Value Ref Range   Salicylate Lvl <7.0 2.8 - 30.0 mg/dL    Comment: Performed at [facility], [address] [place], [place] [address]  Acetaminophen level     Status: Abnormal   Collection Time: 03/29/18  5:49 PM  Result Value Ref Range   Acetaminophen (Tylenol), Serum <10 (L) 10 - 30 ug/mL    Comment: (NOTE) Therapeutic concentrations vary significantly. A range of 10-30 ug/mL  may be an effective concentration for many patients. However, some  are best treated at concentrations outside of this range. Acetaminophen concentrations >150 ug/mL at 4 hours after ingestion  and >50 ug/mL at 12 hours after ingestion are often associated with  toxic  reactions. Performed at [facility], [address], [place], [place] [address]   cbc     Status: None   Collection Time: 03/29/18  5:49 PM  Result Value Ref Range   WBC 8.1 4.0 - 10.5 K/uL   RBC 5.14 4.22 - 5.81 MIL/uL   Hemoglobin 16.1 13.0 - 17.0 g/dL   HCT 29.5 62.1 - 30.8 %   MCV 94.9 80.0 - 100.0 fL   MCH 31.3 26.0 - 34.0 pg   MCHC 33.0 30.0 - 36.0 g/dL   RDW 65.7 84.6 - 96.2 %   Platelets 251 150 - 400 K/uL   nRBC 0.0 0.0 - 0.2 %    Comment: Performed at [facility], [address] [place], [place] [address]  Rapid urine drug screen (hospital performed)     Status: None   Collection Time: 03/29/18  5:49 PM  Result Value Ref Range   Opiates NONE DETECTED NONE DETECTED   Cocaine NONE DETECTED NONE DETECTED   Benzodiazepines NONE DETECTED NONE DETECTED   Amphetamines NONE DETECTED NONE DETECTED   Tetrahydrocannabinol NONE DETECTED NONE DETECTED   Barbiturates NONE DETECTED NONE DETECTED    Comment: (NOTE) DRUG SCREEN FOR MEDICAL PURPOSES ONLY.  IF CONFIRMATION IS NEEDED FOR ANY PURPOSE, NOTIFY LAB WITHIN 5 DAYS. LOWEST DETECTABLE LIMITS FOR URINE DRUG SCREEN Drug Class                     Cutoff (ng/mL) Amphetamine and metabolites    1000 Barbiturate and metabolites    200 Benzodiazepine                 200 Tricyclics and metabolites     300 Opiates and metabolites        300 Cocaine and metabolites        300 THC                            50 Performed at [facility], [address], [place], [place] [address]     Current Facility-Administered Medications  Medication Dose Route Frequency Provider Last Rate Last Dose  . LORazepam (ATIVAN) injection 0-4 mg  0-4 mg Intravenous Q6H Loren Racer, MD  Or  . LORazepam (ATIVAN) tablet 0-4 mg  0-4 mg Oral Q6H Loren RacerYelverton, David, MD      . Melene Muller[START ON 04/01/2018] LORazepam (ATIVAN) injection 0-4 mg  0-4 mg Intravenous Q12H Loren RacerYelverton, David, MD       Or   . Melene Muller[START ON 04/01/2018] LORazepam (ATIVAN) tablet 0-4 mg  0-4 mg Oral Q12H Loren RacerYelverton, David, MD      . thiamine (VITAMIN B-1) tablet 100 mg  100 mg Oral Daily Loren RacerYelverton, David, MD   100 mg at 03/29/18 2047   Or  . thiamine (B-1) injection 100 mg  100 mg Intravenous Daily Loren RacerYelverton, David, MD       No current outpatient medications on file.    Musculoskeletal: Strength & Muscle Tone: within normal limits Gait & Station: not observed Patient leans: N/A  Psychiatric Specialty Exam: Physical Exam  Nursing note and vitals reviewed. Constitutional: He is oriented to person, place, and time. He appears well-developed and well-nourished.  HENT:  Head: Normocephalic and atraumatic.  Neck: Normal range of motion.  Respiratory: Effort normal.  Musculoskeletal: Normal range of motion.  Neurological: He is alert and oriented to person, place, and time.  Psychiatric: His speech is normal and behavior is normal. Cognition and memory are normal. He expresses impulsivity. He exhibits a depressed mood. He expresses no homicidal and no suicidal ideation.    Review of Systems  Constitutional: Negative.   Respiratory: Negative.   Neurological: Negative for dizziness, tingling, tremors, seizures, weakness and headaches.  Psychiatric/Behavioral: Positive for depression and substance abuse. Negative for hallucinations and suicidal ideas. The patient is nervous/anxious and has insomnia.     Blood pressure 124/87, pulse 84, temperature 99 F (37.2 C), temperature source Oral, resp. rate 18, height 5\' 9"  (1.753 m), weight 72.6 kg, SpO2 97 %.Body mass index is 23.63 kg/m.  General Appearance: Casual  Eye Contact:  Good  Speech:  Normal Rate  Volume:  Normal  Mood:  Depressed  Affect:  Constricted  Thought Process:  Coherent  Orientation:  Full (Time, Place, and Person)  Thought Content:  Logical  Suicidal Thoughts:  No  Homicidal Thoughts:  No  Memory:  Immediate;   Fair Recent;   Fair Remote;    Fair  Judgement:  Impaired  Insight:  Shallow  Psychomotor Activity:  Normal  Concentration:  Concentration: Fair and Attention Span: Fair  Recall:  [employer]Fair  Fund of Knowledge:  Fair  Language:  Good  Akathisia:  No  Handed:  Right  AIMS (if indicated):   N/A  Assets:  Communication Skills Desire for Improvement Housing Physical Health Social Support  ADL's:  Intact  Cognition:  WNL  Sleep:   N/A     Treatment Plan Summary: Daily contact with patient to assess and evaluate symptoms and progress in treatment and Medication management Pt requested prescriptions for depression. At this time will dispense prescriptions for 15 day quantity. Wellbutin XL 150mg  po daily for depression. Buspar 5mg  po BID for anxiety.    Disposition: No evidence of imminent risk to self or others at present.   Supportive therapy provided about ongoing stressors. Psychiatric team recommends inpatient due to risk factors. Colalteral obtained from Mom who assumes care and responsibility for safety. Will discharge at this time. Will have peer support talk with him for alcohol use.   Maryagnes Amosakia S Starkes-Perry, FNP 03/30/2018 9:36 AM    Patient seen face-to-face for psychiatric evaluation, chart reviewed and case discussed with the physician extender and developed treatment plan.  Reviewed the information documented and agree with the treatment plan.  Juanetta Beets, DO 03/30/18 1:47 PM

## 2018-03-30 NOTE — ED Notes (Signed)
[name] is pts mother, she called and left her number:  Robert Mayer: [phone]

## 2018-03-30 NOTE — ED Notes (Signed)
Lauro FranklinSherry Boulais (mother) states that patient needs to work, but he also needs med. She notes, "feels comfortable with him being discharge."

## 2018-03-30 NOTE — ED Notes (Signed)
Educated pt on discharge instructions, [facility] and prescription. Pt did not have any questions or concerns.

## 2019-07-19 ENCOUNTER — Telehealth (HOSPITAL_COMMUNITY): Payer: Self-pay | Admitting: Psychiatry

## 2019-11-17 IMAGING — DX DG FOREARM 2V*R*
2 series · 2 of 2 positions shown · non-contrast
Comparison: None.

CLINICAL DATA: Direct trauma to the right forearm now with pain in
the medial mid ulnar region with limited range of motion.

EXAM:
RIGHT FOREARM - 2 VIEW

[forearm ap]
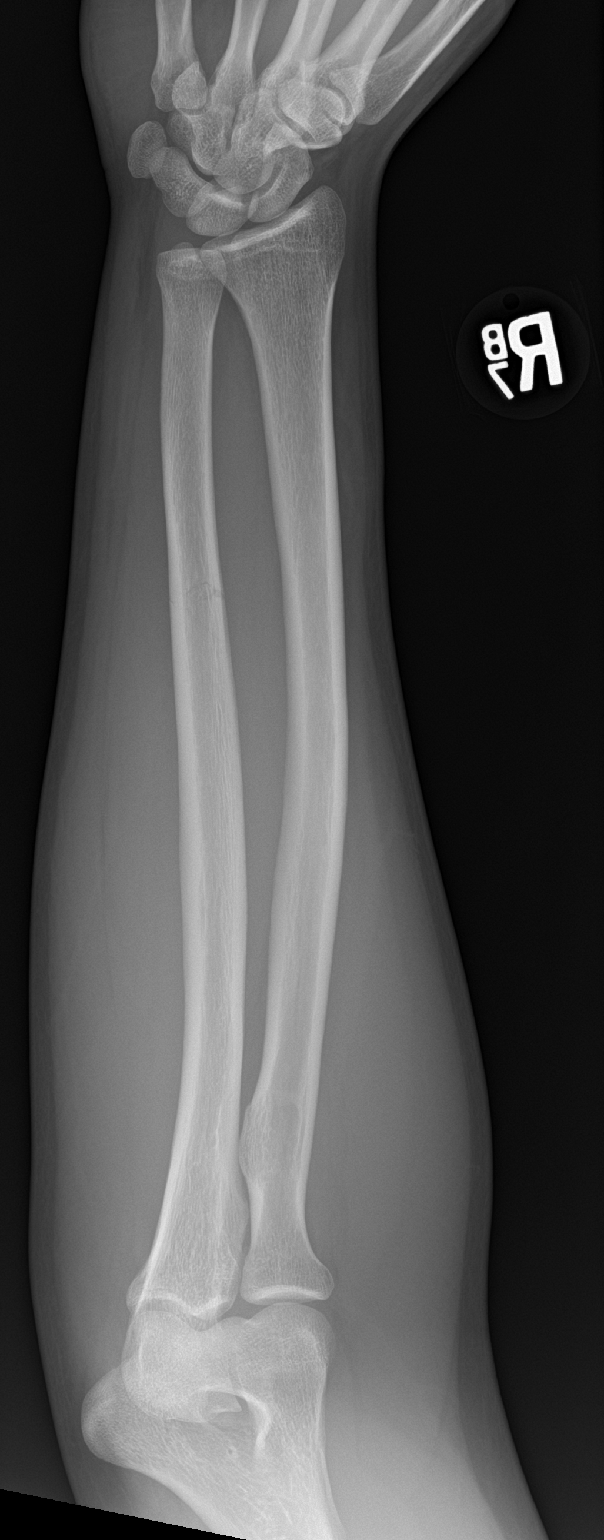

[forearm lat]
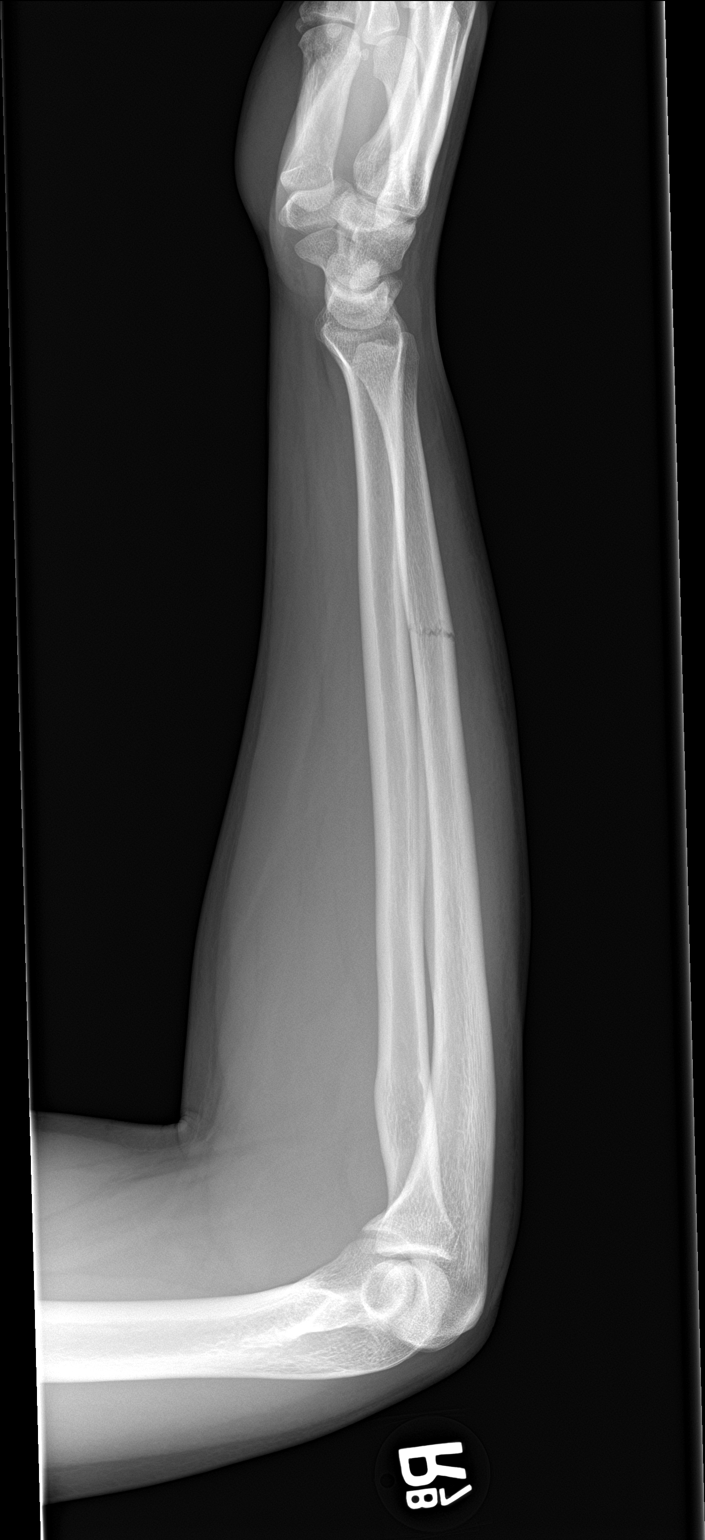

[2 of 2 positions shown; findings below may reference images not displayed]

FINDINGS: The patient has sustained an acute nondisplaced fracture of the
junction of the middle and distal thirds of the right ulna. The
radius is intact. There is mild soft tissue swelling over the
extensor surface of the forearm. The observed portions of the wrist
and elbow are normal.
IMPRESSION: There is an acute nondisplaced fracture of the junction of the
middle and distal thirds of the shaft of the right ulna.
